# Patient Record
Sex: Female | Born: 1950
Health system: Southern US, Community
[De-identification: ages and names within clinical notes are randomized; demographics above are authoritative.]

## PROBLEM LIST (undated history)

## (undated) DIAGNOSIS — E7800 Pure hypercholesterolemia, unspecified: Secondary | ICD-10-CM

## (undated) DIAGNOSIS — H04129 Dry eye syndrome of unspecified lacrimal gland: Secondary | ICD-10-CM

## (undated) DIAGNOSIS — K449 Diaphragmatic hernia without obstruction or gangrene: Secondary | ICD-10-CM

## (undated) DIAGNOSIS — E079 Disorder of thyroid, unspecified: Secondary | ICD-10-CM

## (undated) DIAGNOSIS — M810 Age-related osteoporosis without current pathological fracture: Secondary | ICD-10-CM

## (undated) DIAGNOSIS — I10 Essential (primary) hypertension: Secondary | ICD-10-CM

## (undated) HISTORY — DX: Age-related osteoporosis without current pathological fracture: M81.0

## (undated) HISTORY — DX: Dry eye syndrome of unspecified lacrimal gland: H04.129

## (undated) HISTORY — DX: Essential (primary) hypertension: I10

## (undated) HISTORY — DX: Diaphragmatic hernia without obstruction or gangrene: K44.9

## (undated) HISTORY — DX: Pure hypercholesterolemia, unspecified: E78.00

## (undated) HISTORY — DX: Disorder of thyroid, unspecified: E07.9

---

## 1998-07-15 ENCOUNTER — Ambulatory Visit (HOSPITAL_COMMUNITY): Admission: RE | Admit: 1998-07-15 | Discharge: 1998-07-16 | Payer: Self-pay | Admitting: General Surgery

## 1998-11-04 ENCOUNTER — Other Ambulatory Visit: Admission: RE | Admit: 1998-11-04 | Discharge: 1998-11-04 | Payer: Self-pay | Admitting: Obstetrics and Gynecology

## 1999-11-18 ENCOUNTER — Other Ambulatory Visit: Admission: RE | Admit: 1999-11-18 | Discharge: 1999-11-18 | Payer: Self-pay | Admitting: Obstetrics and Gynecology

## 2002-01-03 ENCOUNTER — Other Ambulatory Visit: Admission: RE | Admit: 2002-01-03 | Discharge: 2002-01-03 | Payer: Self-pay | Admitting: Obstetrics and Gynecology

## 2003-01-10 ENCOUNTER — Other Ambulatory Visit: Admission: RE | Admit: 2003-01-10 | Discharge: 2003-01-10 | Payer: Self-pay | Admitting: Obstetrics and Gynecology

## 2013-07-04 ENCOUNTER — Other Ambulatory Visit: Payer: Self-pay | Admitting: Obstetrics and Gynecology

## 2013-07-04 DIAGNOSIS — R928 Other abnormal and inconclusive findings on diagnostic imaging of breast: Secondary | ICD-10-CM

## 2013-07-14 ENCOUNTER — Ambulatory Visit
Admission: RE | Admit: 2013-07-14 | Discharge: 2013-07-14 | Disposition: A | Discharge status: Home / Self Care | Payer: BC Managed Care – PPO | Source: Ambulatory Visit | Attending: Obstetrics and Gynecology | Admitting: Obstetrics and Gynecology

## 2013-07-14 ENCOUNTER — Other Ambulatory Visit: Payer: Self-pay | Admitting: Obstetrics and Gynecology

## 2013-07-14 DIAGNOSIS — R928 Other abnormal and inconclusive findings on diagnostic imaging of breast: Secondary | ICD-10-CM

## 2013-07-19 ENCOUNTER — Ambulatory Visit
Admission: RE | Admit: 2013-07-19 | Discharge: 2013-07-19 | Disposition: A | Discharge status: Home / Self Care | Payer: BC Managed Care – PPO | Source: Ambulatory Visit | Attending: Obstetrics and Gynecology | Admitting: Obstetrics and Gynecology

## 2013-07-19 ENCOUNTER — Other Ambulatory Visit: Payer: Self-pay | Admitting: Obstetrics and Gynecology

## 2013-07-19 DIAGNOSIS — R928 Other abnormal and inconclusive findings on diagnostic imaging of breast: Secondary | ICD-10-CM

## 2014-06-01 IMAGING — MG MM DIAGNOSTIC UNILATERAL R
3 series · 3 of 3 positions shown · non-contrast
Comparison: Previous exams.

CLINICAL DATA: Patient presents for additional views of the right
breast as followup to a recent screening exam to evaluate a possible
mass.

EXAM:
DIGITAL DIAGNOSTIC  right MAMMOGRAM
ULTRASOUND right BREAST

[R CC]
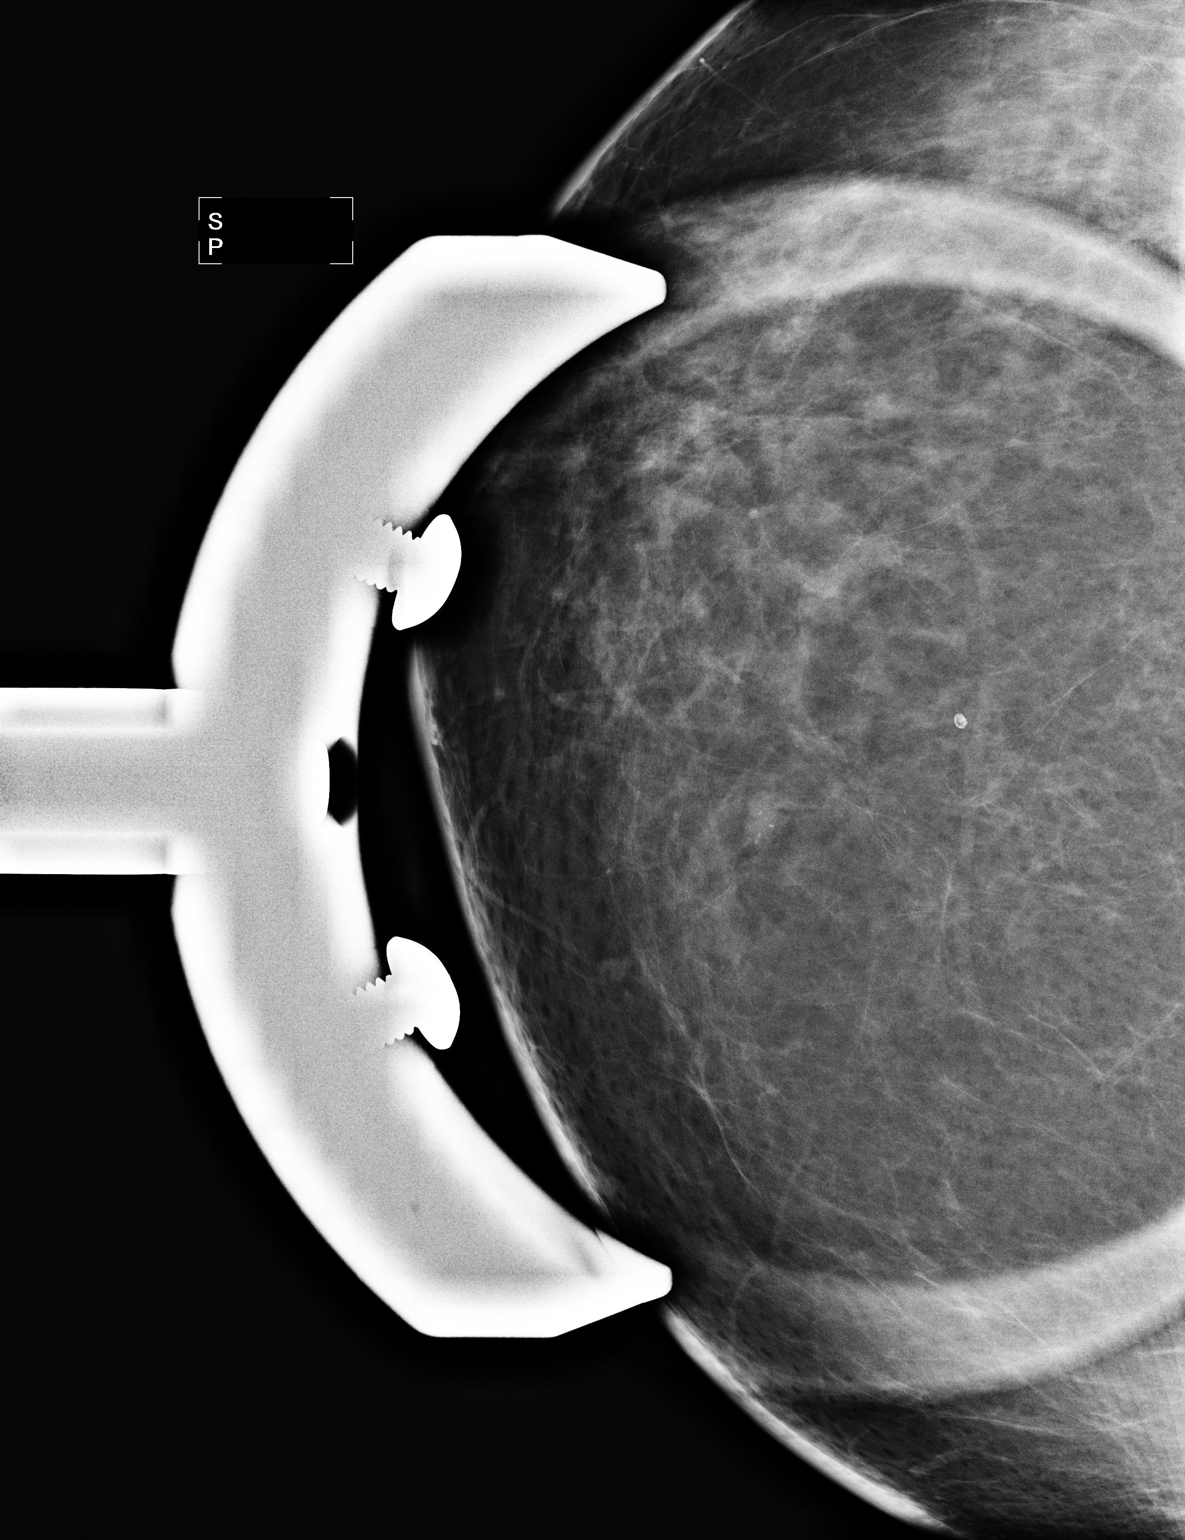

[R ML (1 of 2)]
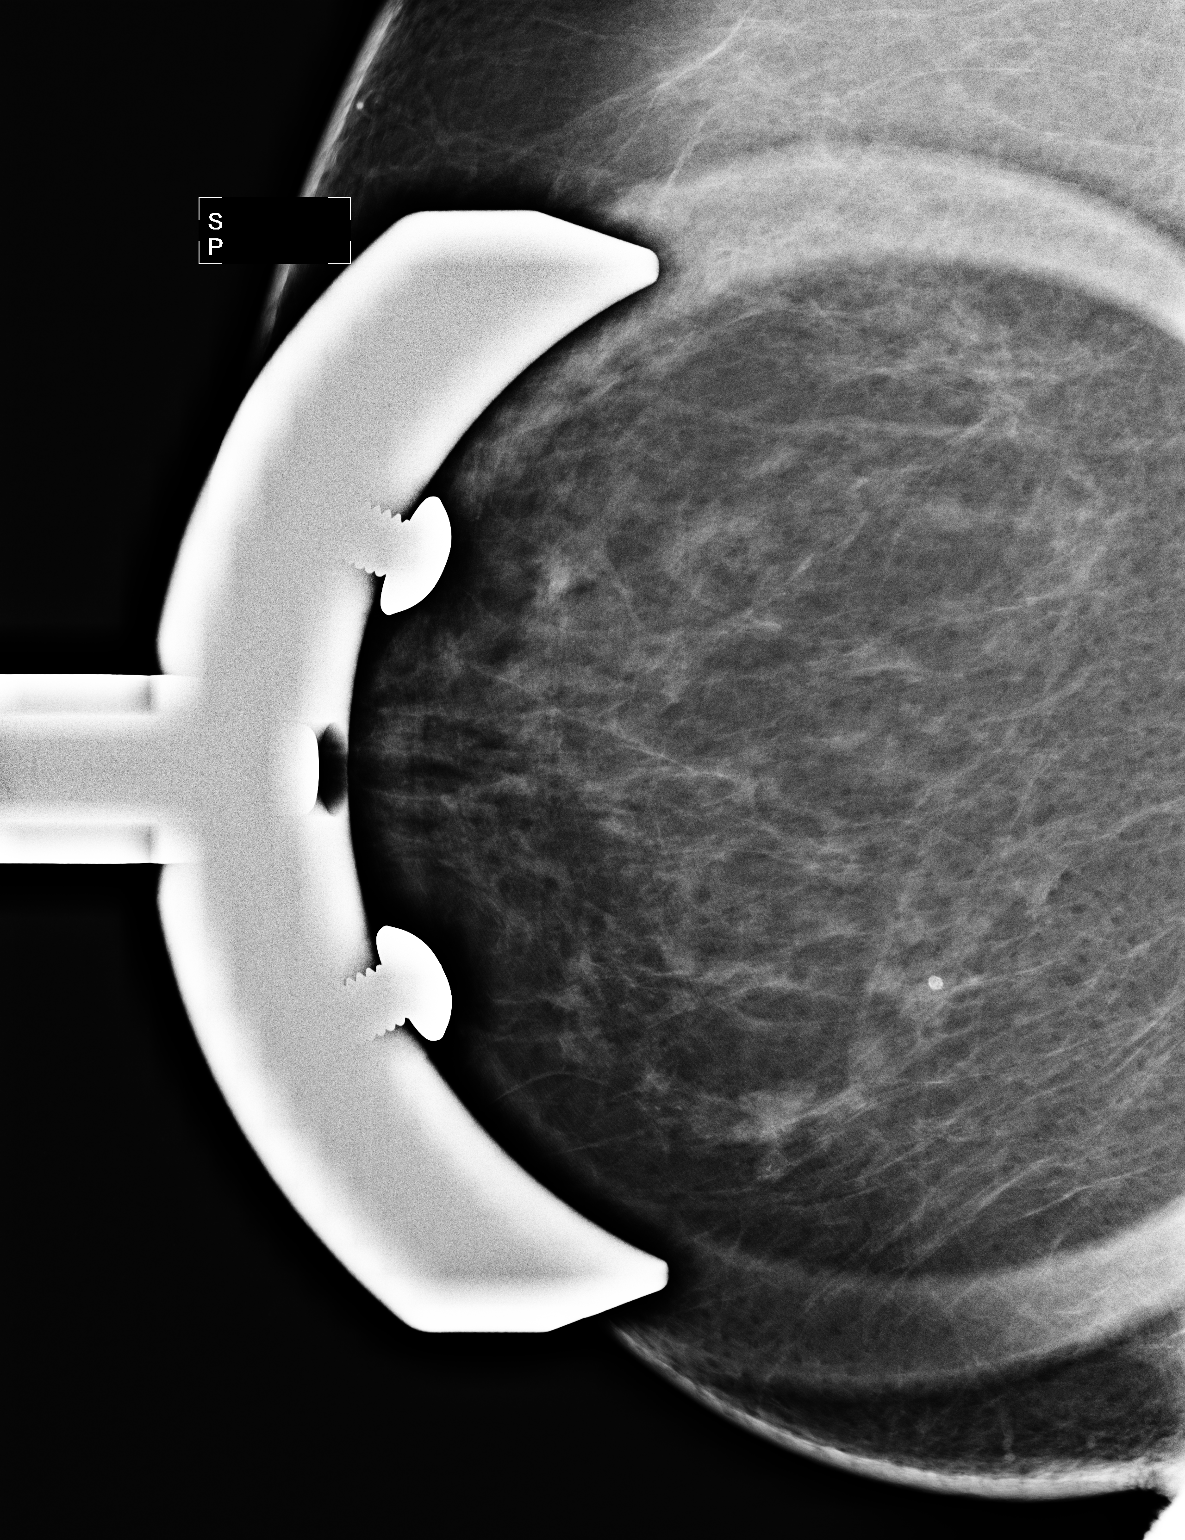

[R ML (2 of 2)]
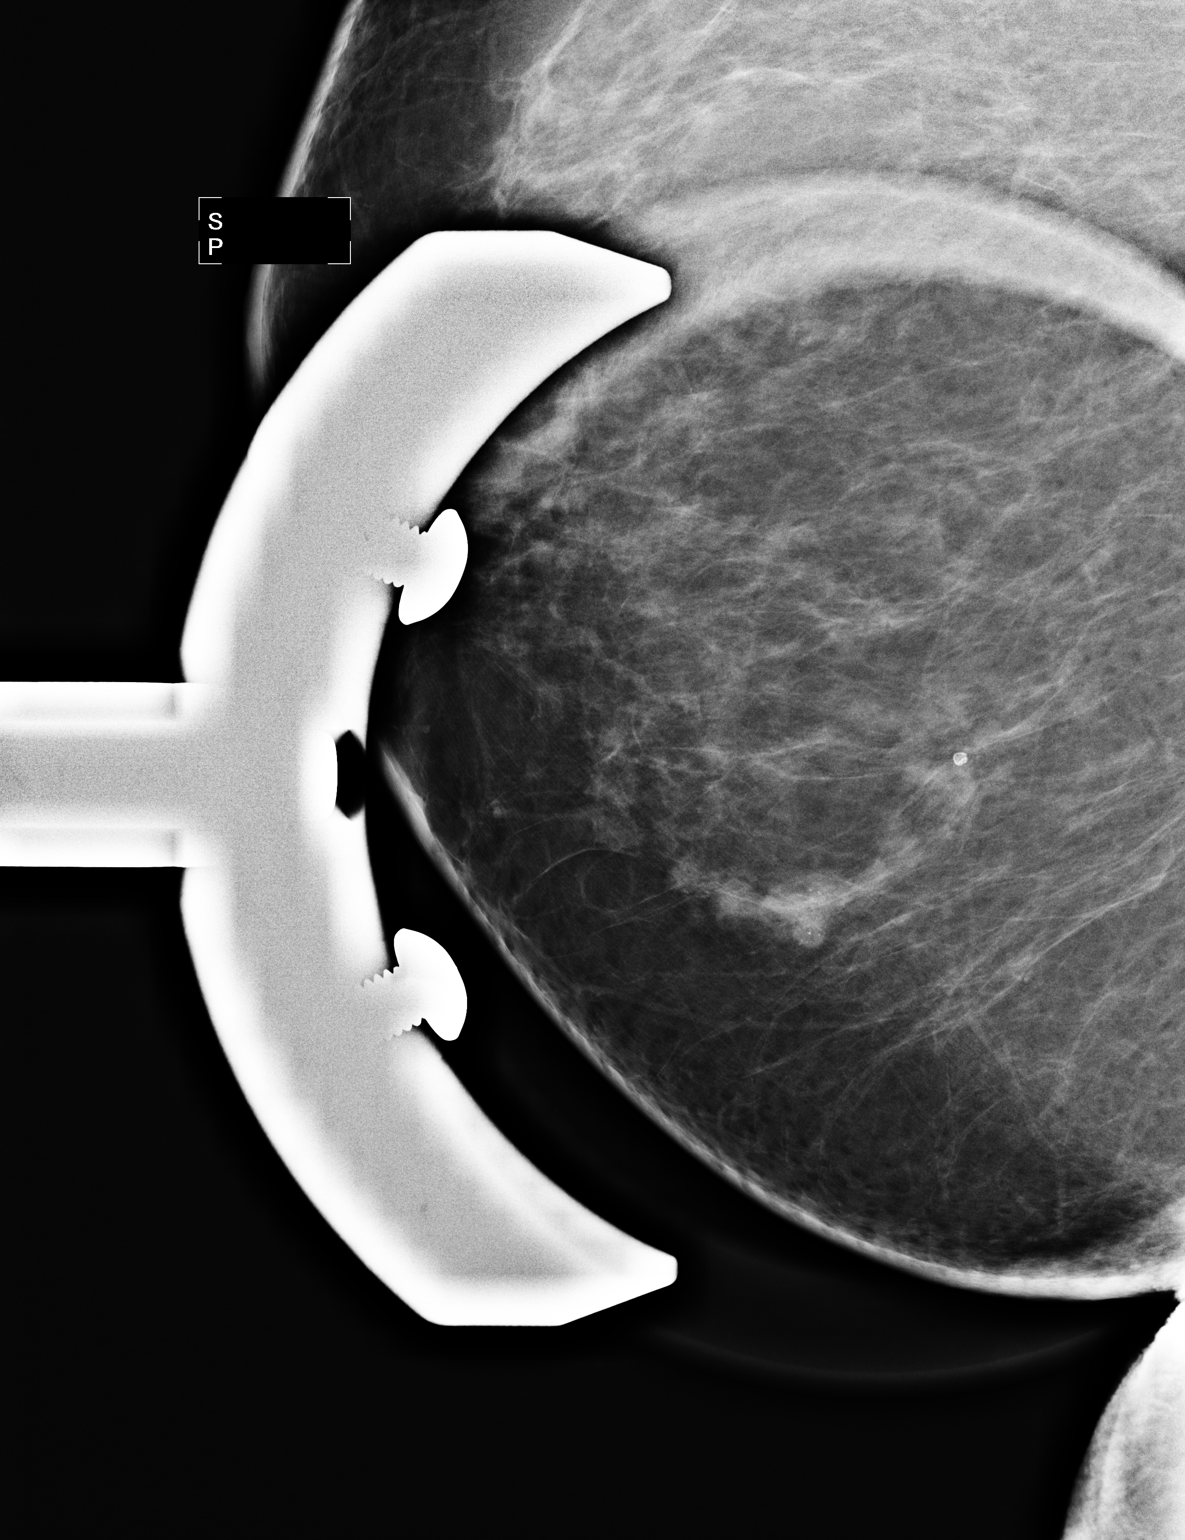

[3 of 3 positions shown; findings below may reference images not displayed]

ACR Breast Density Category b: There are scattered areas of
fibroglandular density.
FINDINGS: Spot magnification images demonstrate a 5 mm ovoid mass with
associated mildly heterogeneous microcalcifications over the inner
lower right breast.

Ultrasound is performed, showing an ovoid hypoechoic mass with
microcalcifications and somewhat indistinct borders over the 4 to 5
o'clock position of the right breast 3-4 cm from the nipple likely
corresponding to the mammographic abnormality. Ultrasound of the
right axilla is unremarkable.
IMPRESSION: Indeterminate 5 mm mass with associated microcalcifications over the
inner lower right breast.

RECOMMENDATION:
Recommend ultrasound core needle biopsy for further evaluation.

I have discussed the findings and recommendations with the patient.
Results were also provided in writing at the conclusion of the
visit. If applicable, a reminder letter will be sent to the patient
regarding the next appointment.

BI-RADS CATEGORY  4: Suspicious abnormality - biopsy should be
considered.

Biopsy scheduled for 07/19/2013 at 8 a.m..

## 2014-06-06 IMAGING — MG MM RT BREAST BX W LOC 1ST LESION STEREO
3 series · 3 of 3 positions shown · non-contrast
Comparison: Previous exams.

ADDENDUM:
Pathology revealed fibrocystic changes with calcifications in the
right breast. This was found to be concordant by Dr. Stev Borge.
Pathology was relayed to the patient by telephone. The patient
reported tenderness at the biopsy site. Post biopsy instructions
were reviewed and her questions were answered. She was encouraged to
call The [REDACTED] for any additional
concerns. She was asked to return to [HOSPITAL] for annual
mammography.

Pathology results reported by Kagiso Kg Paballo RN, BSN on July 20, 2013.
CLINICAL DATA: Right breast calcifications and associated mass.
EXAM:
RIGHT STEREOTACTIC CORE NEEDLE BIOPSY

[R CC (1 of 2)]
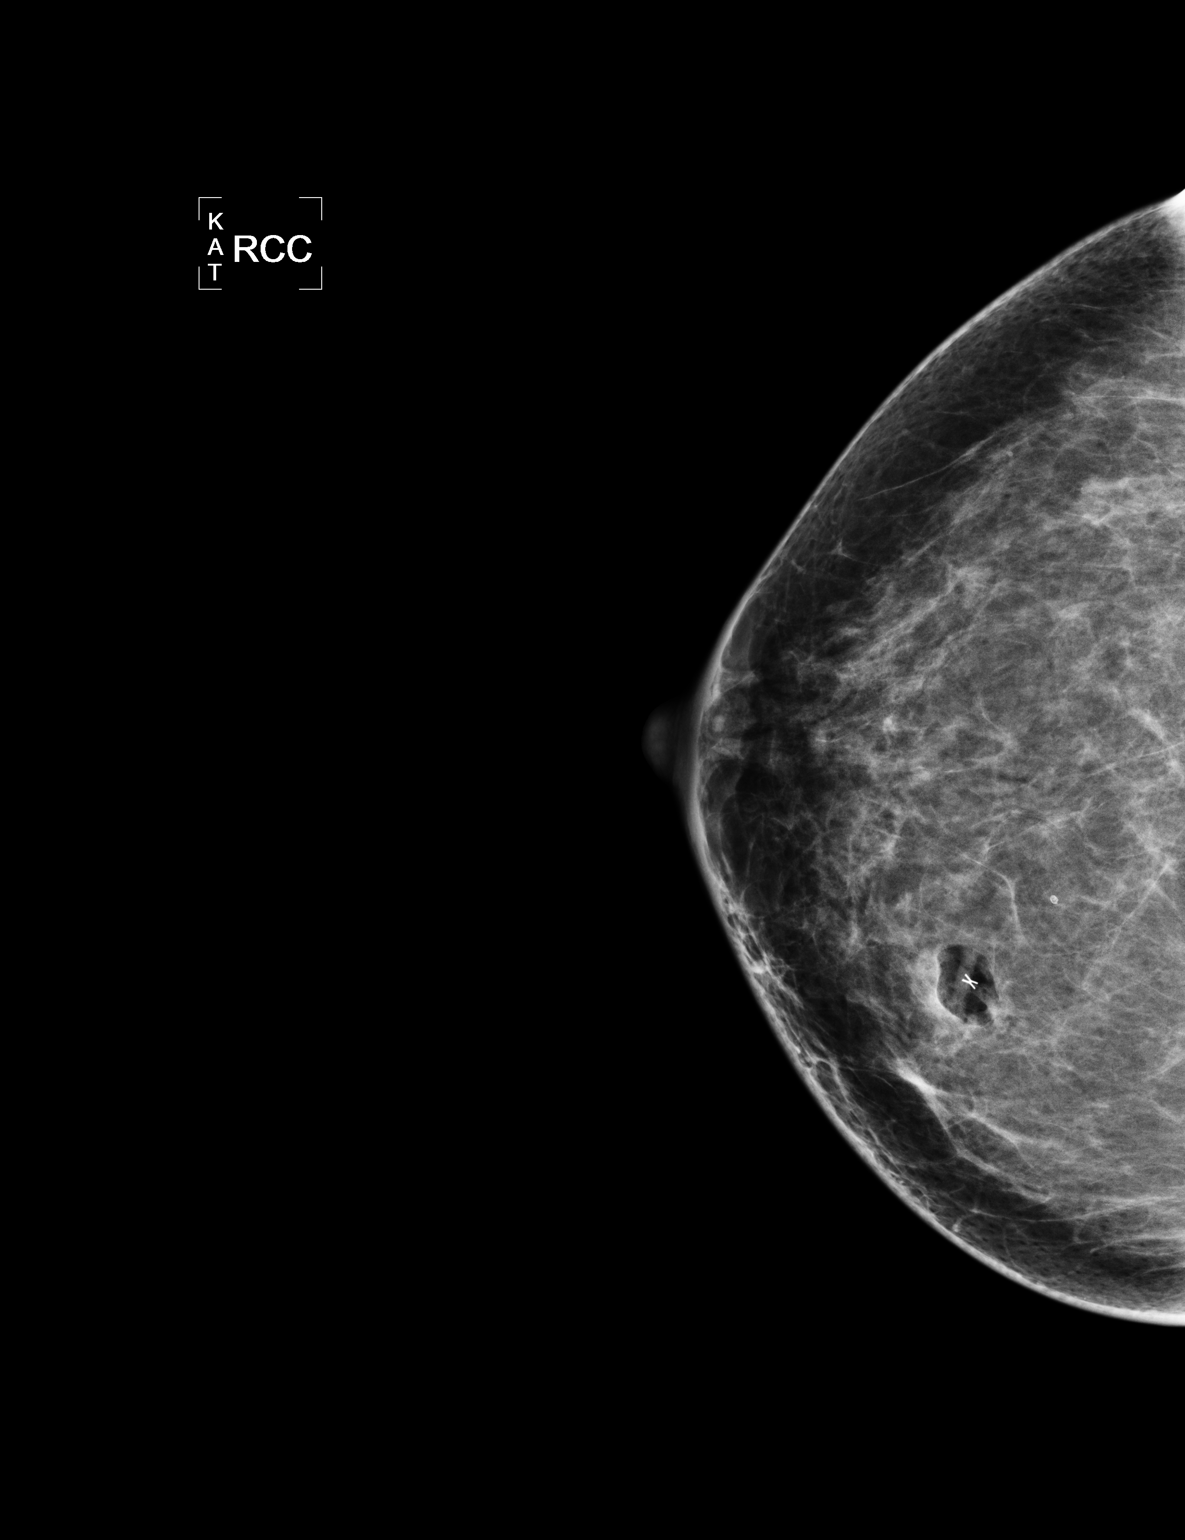

[R CC (2 of 2)]
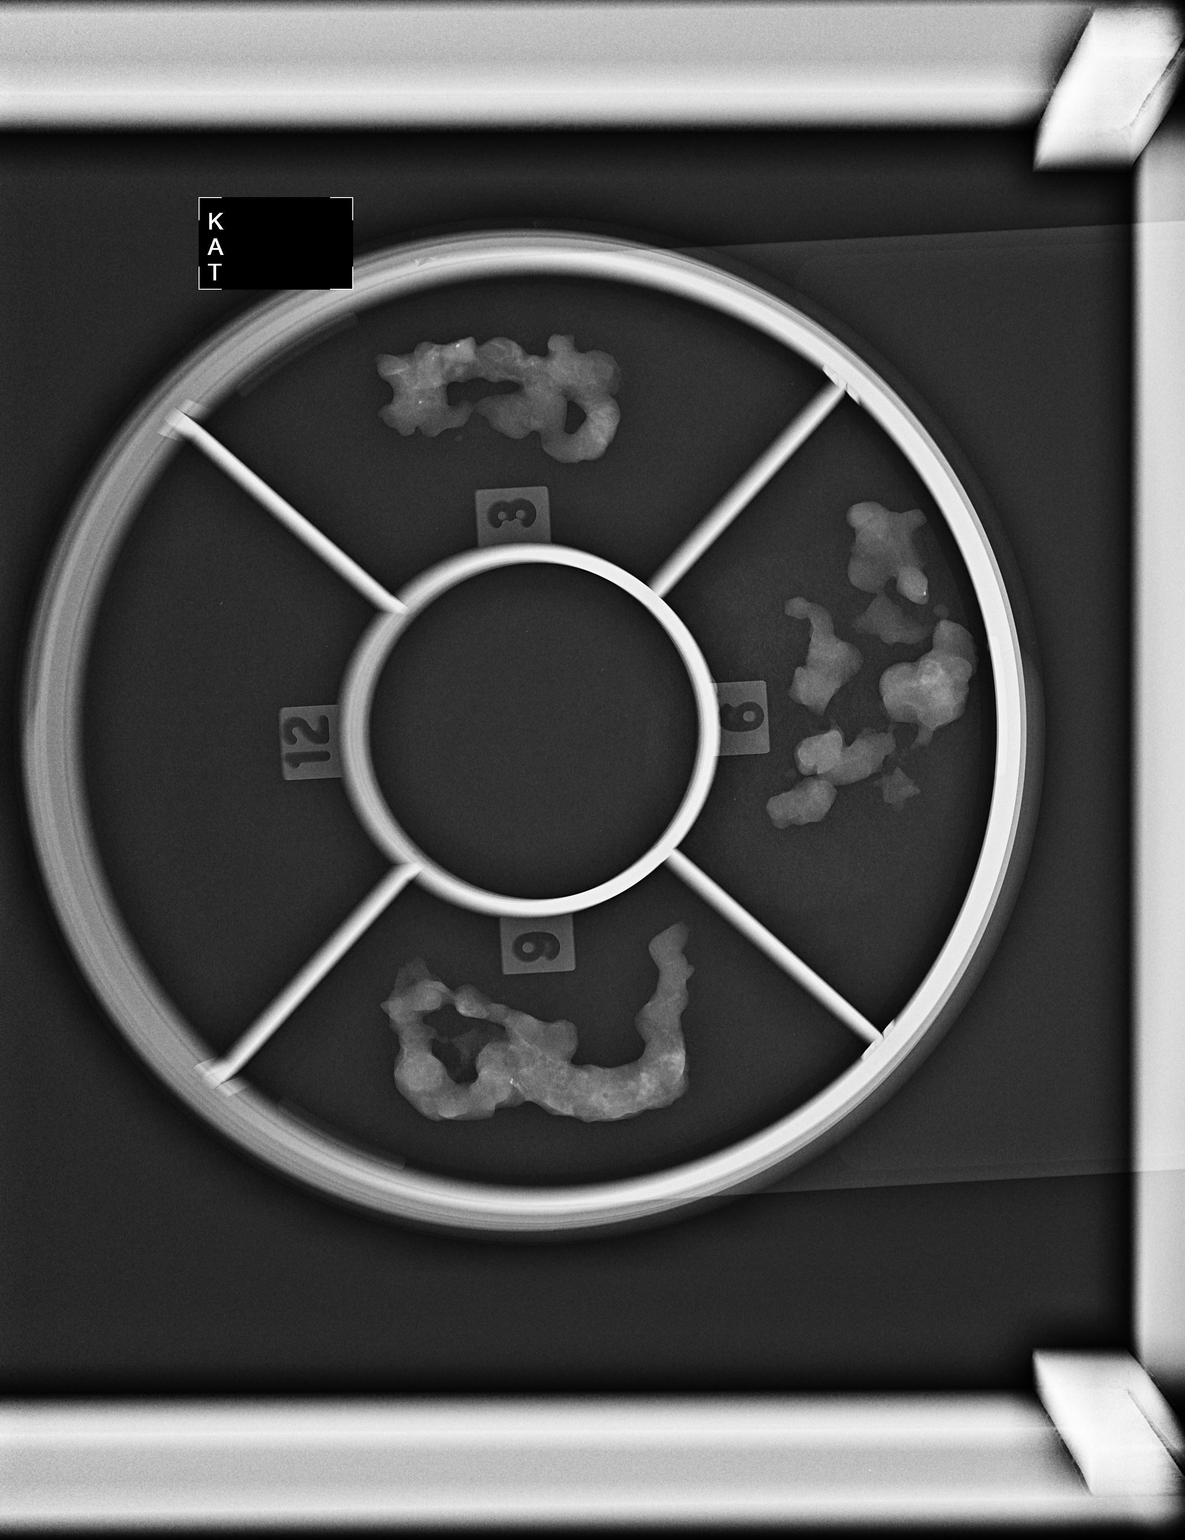

[R ML]
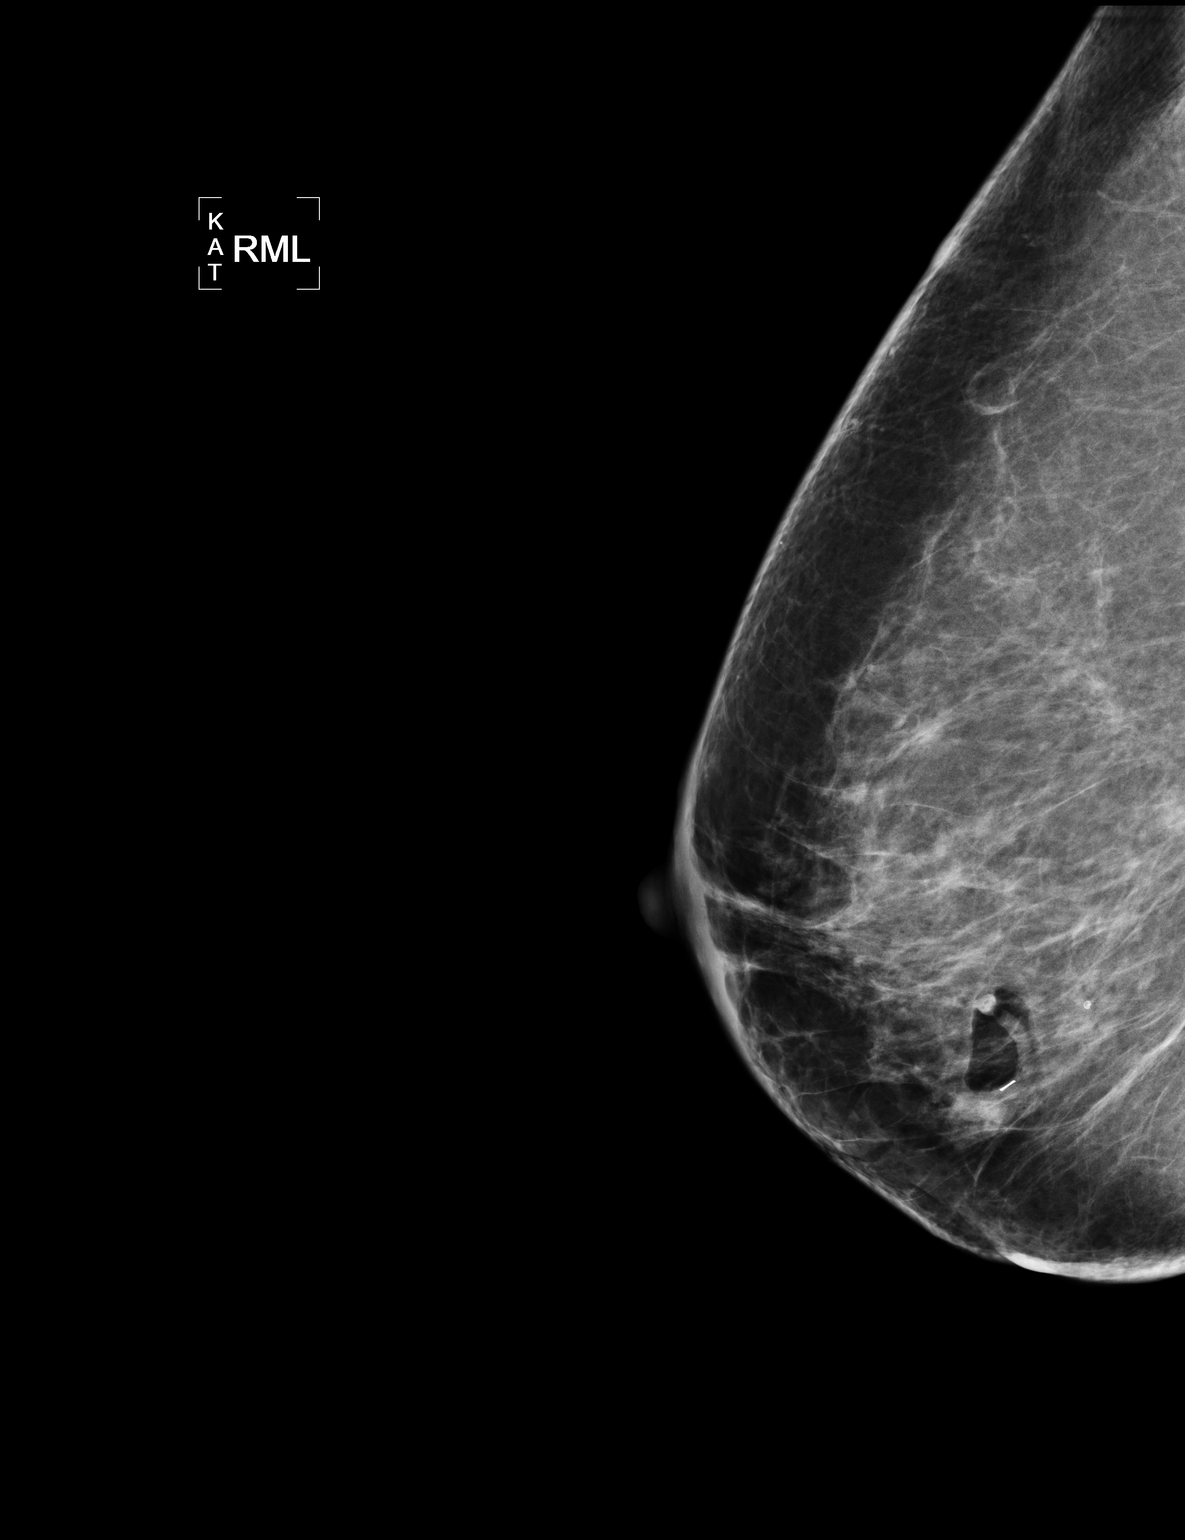

[3 of 3 positions shown; findings below may reference images not displayed]



Using sterile technique and 2% Lidocaine as local anesthetic, under
stereotactic guidance, a 9 gauge vacuum assisted core needle biopsy
device was used to perform core needle biopsy of calcifications and
mass within the lower inner right breast using a caudal approach.
Specimen radiograph was performed showing calcifications. Specimens
with calcifications are identified for pathology.

At the conclusion of the procedure, a H shaped tissue marker clip
was deployed into the biopsy cavity. Follow-up 2-view mammogram
confirmed clip in appropriate position.
IMPRESSION: Stereotactic-guided biopsy of right breast mass and calcifications.
No apparent complications.

## 2015-07-22 DIAGNOSIS — J069 Acute upper respiratory infection, unspecified: Secondary | ICD-10-CM | POA: Diagnosis not present

## 2015-09-10 DIAGNOSIS — R51 Headache: Secondary | ICD-10-CM | POA: Diagnosis not present

## 2015-09-10 DIAGNOSIS — G43019 Migraine without aura, intractable, without status migrainosus: Secondary | ICD-10-CM | POA: Diagnosis not present

## 2015-09-10 DIAGNOSIS — Z79899 Other long term (current) drug therapy: Secondary | ICD-10-CM | POA: Diagnosis not present

## 2015-09-10 DIAGNOSIS — G43719 Chronic migraine without aura, intractable, without status migrainosus: Secondary | ICD-10-CM | POA: Diagnosis not present

## 2015-09-12 DIAGNOSIS — Z01419 Encounter for gynecological examination (general) (routine) without abnormal findings: Secondary | ICD-10-CM | POA: Diagnosis not present

## 2015-09-12 DIAGNOSIS — Z1231 Encounter for screening mammogram for malignant neoplasm of breast: Secondary | ICD-10-CM | POA: Diagnosis not present

## 2016-03-05 DIAGNOSIS — G43719 Chronic migraine without aura, intractable, without status migrainosus: Secondary | ICD-10-CM | POA: Diagnosis not present

## 2016-03-05 DIAGNOSIS — G43019 Migraine without aura, intractable, without status migrainosus: Secondary | ICD-10-CM | POA: Diagnosis not present

## 2016-03-05 DIAGNOSIS — Z23 Encounter for immunization: Secondary | ICD-10-CM | POA: Diagnosis not present

## 2016-03-05 DIAGNOSIS — Z79899 Other long term (current) drug therapy: Secondary | ICD-10-CM | POA: Diagnosis not present

## 2016-04-17 DIAGNOSIS — L255 Unspecified contact dermatitis due to plants, except food: Secondary | ICD-10-CM | POA: Diagnosis not present

## 2016-05-05 DIAGNOSIS — Z23 Encounter for immunization: Secondary | ICD-10-CM | POA: Diagnosis not present

## 2016-05-05 DIAGNOSIS — I10 Essential (primary) hypertension: Secondary | ICD-10-CM | POA: Diagnosis not present

## 2016-05-05 DIAGNOSIS — Z6824 Body mass index (BMI) 24.0-24.9, adult: Secondary | ICD-10-CM | POA: Diagnosis not present

## 2016-05-05 DIAGNOSIS — Z9181 History of falling: Secondary | ICD-10-CM | POA: Diagnosis not present

## 2016-05-05 DIAGNOSIS — Z1389 Encounter for screening for other disorder: Secondary | ICD-10-CM | POA: Diagnosis not present

## 2016-05-05 DIAGNOSIS — E039 Hypothyroidism, unspecified: Secondary | ICD-10-CM | POA: Diagnosis not present

## 2016-05-05 DIAGNOSIS — E785 Hyperlipidemia, unspecified: Secondary | ICD-10-CM | POA: Diagnosis not present

## 2016-05-05 DIAGNOSIS — Z79899 Other long term (current) drug therapy: Secondary | ICD-10-CM | POA: Diagnosis not present

## 2016-08-27 DIAGNOSIS — G43019 Migraine without aura, intractable, without status migrainosus: Secondary | ICD-10-CM | POA: Diagnosis not present

## 2016-08-27 DIAGNOSIS — G43719 Chronic migraine without aura, intractable, without status migrainosus: Secondary | ICD-10-CM | POA: Diagnosis not present

## 2016-09-14 DIAGNOSIS — Z01419 Encounter for gynecological examination (general) (routine) without abnormal findings: Secondary | ICD-10-CM | POA: Diagnosis not present

## 2016-09-14 DIAGNOSIS — Z1231 Encounter for screening mammogram for malignant neoplasm of breast: Secondary | ICD-10-CM | POA: Diagnosis not present

## 2017-02-23 DIAGNOSIS — Z23 Encounter for immunization: Secondary | ICD-10-CM | POA: Diagnosis not present

## 2017-03-10 DIAGNOSIS — Z800 Family history of malignant neoplasm of digestive organs: Secondary | ICD-10-CM | POA: Diagnosis not present

## 2017-03-10 DIAGNOSIS — Z6823 Body mass index (BMI) 23.0-23.9, adult: Secondary | ICD-10-CM | POA: Diagnosis not present

## 2017-03-10 DIAGNOSIS — Z1211 Encounter for screening for malignant neoplasm of colon: Secondary | ICD-10-CM | POA: Diagnosis not present

## 2017-03-10 DIAGNOSIS — K625 Hemorrhage of anus and rectum: Secondary | ICD-10-CM | POA: Diagnosis not present

## 2017-03-30 DIAGNOSIS — Z800 Family history of malignant neoplasm of digestive organs: Secondary | ICD-10-CM | POA: Diagnosis not present

## 2017-03-30 DIAGNOSIS — K921 Melena: Secondary | ICD-10-CM | POA: Diagnosis not present

## 2017-04-14 DIAGNOSIS — Z1211 Encounter for screening for malignant neoplasm of colon: Secondary | ICD-10-CM | POA: Diagnosis not present

## 2017-04-14 DIAGNOSIS — Z800 Family history of malignant neoplasm of digestive organs: Secondary | ICD-10-CM | POA: Diagnosis not present

## 2017-07-06 DIAGNOSIS — Z0000 Encounter for general adult medical examination without abnormal findings: Secondary | ICD-10-CM | POA: Diagnosis not present

## 2017-07-06 DIAGNOSIS — E039 Hypothyroidism, unspecified: Secondary | ICD-10-CM | POA: Diagnosis not present

## 2017-07-06 DIAGNOSIS — E785 Hyperlipidemia, unspecified: Secondary | ICD-10-CM | POA: Diagnosis not present

## 2017-07-06 DIAGNOSIS — M8580 Other specified disorders of bone density and structure, unspecified site: Secondary | ICD-10-CM | POA: Diagnosis not present

## 2017-07-06 DIAGNOSIS — I10 Essential (primary) hypertension: Secondary | ICD-10-CM | POA: Diagnosis not present

## 2017-07-06 DIAGNOSIS — Z6823 Body mass index (BMI) 23.0-23.9, adult: Secondary | ICD-10-CM | POA: Diagnosis not present

## 2017-07-06 DIAGNOSIS — Z79899 Other long term (current) drug therapy: Secondary | ICD-10-CM | POA: Diagnosis not present

## 2017-08-16 DIAGNOSIS — G43019 Migraine without aura, intractable, without status migrainosus: Secondary | ICD-10-CM | POA: Diagnosis not present

## 2017-08-16 DIAGNOSIS — G43719 Chronic migraine without aura, intractable, without status migrainosus: Secondary | ICD-10-CM | POA: Diagnosis not present

## 2017-09-29 DIAGNOSIS — Z6822 Body mass index (BMI) 22.0-22.9, adult: Secondary | ICD-10-CM | POA: Diagnosis not present

## 2017-09-29 DIAGNOSIS — Z1231 Encounter for screening mammogram for malignant neoplasm of breast: Secondary | ICD-10-CM | POA: Diagnosis not present

## 2017-09-29 DIAGNOSIS — Z01419 Encounter for gynecological examination (general) (routine) without abnormal findings: Secondary | ICD-10-CM | POA: Diagnosis not present

## 2018-02-28 DIAGNOSIS — Z23 Encounter for immunization: Secondary | ICD-10-CM | POA: Diagnosis not present

## 2018-04-12 DIAGNOSIS — G43719 Chronic migraine without aura, intractable, without status migrainosus: Secondary | ICD-10-CM | POA: Diagnosis not present

## 2018-04-12 DIAGNOSIS — G43019 Migraine without aura, intractable, without status migrainosus: Secondary | ICD-10-CM | POA: Diagnosis not present

## 2018-05-17 DIAGNOSIS — E039 Hypothyroidism, unspecified: Secondary | ICD-10-CM | POA: Diagnosis not present

## 2018-05-17 DIAGNOSIS — E785 Hyperlipidemia, unspecified: Secondary | ICD-10-CM | POA: Diagnosis not present

## 2018-05-17 DIAGNOSIS — I10 Essential (primary) hypertension: Secondary | ICD-10-CM | POA: Diagnosis not present

## 2018-08-17 DIAGNOSIS — E039 Hypothyroidism, unspecified: Secondary | ICD-10-CM | POA: Diagnosis not present

## 2018-08-17 DIAGNOSIS — C7972 Secondary malignant neoplasm of left adrenal gland: Secondary | ICD-10-CM | POA: Diagnosis not present

## 2018-08-17 DIAGNOSIS — E871 Hypo-osmolality and hyponatremia: Secondary | ICD-10-CM | POA: Diagnosis not present

## 2018-08-17 DIAGNOSIS — Z23 Encounter for immunization: Secondary | ICD-10-CM | POA: Diagnosis not present

## 2018-08-17 DIAGNOSIS — K921 Melena: Secondary | ICD-10-CM | POA: Diagnosis not present

## 2018-08-17 DIAGNOSIS — Z1159 Encounter for screening for other viral diseases: Secondary | ICD-10-CM | POA: Diagnosis not present

## 2018-08-17 DIAGNOSIS — D692 Other nonthrombocytopenic purpura: Secondary | ICD-10-CM | POA: Diagnosis not present

## 2018-08-17 DIAGNOSIS — Z6824 Body mass index (BMI) 24.0-24.9, adult: Secondary | ICD-10-CM | POA: Diagnosis not present

## 2018-08-17 DIAGNOSIS — E7800 Pure hypercholesterolemia, unspecified: Secondary | ICD-10-CM | POA: Diagnosis not present

## 2018-08-17 DIAGNOSIS — Z79899 Other long term (current) drug therapy: Secondary | ICD-10-CM | POA: Diagnosis not present

## 2018-08-17 DIAGNOSIS — Z0000 Encounter for general adult medical examination without abnormal findings: Secondary | ICD-10-CM | POA: Diagnosis not present

## 2018-08-17 DIAGNOSIS — I2510 Atherosclerotic heart disease of native coronary artery without angina pectoris: Secondary | ICD-10-CM | POA: Diagnosis not present

## 2018-08-17 DIAGNOSIS — N6092 Unspecified benign mammary dysplasia of left breast: Secondary | ICD-10-CM | POA: Diagnosis not present

## 2018-08-17 DIAGNOSIS — C3490 Malignant neoplasm of unspecified part of unspecified bronchus or lung: Secondary | ICD-10-CM | POA: Diagnosis not present

## 2018-08-17 DIAGNOSIS — I10 Essential (primary) hypertension: Secondary | ICD-10-CM | POA: Diagnosis not present

## 2018-08-17 DIAGNOSIS — E785 Hyperlipidemia, unspecified: Secondary | ICD-10-CM | POA: Diagnosis not present

## 2018-08-17 DIAGNOSIS — M810 Age-related osteoporosis without current pathological fracture: Secondary | ICD-10-CM | POA: Diagnosis not present

## 2018-08-17 DIAGNOSIS — R3129 Other microscopic hematuria: Secondary | ICD-10-CM | POA: Diagnosis not present

## 2018-08-17 DIAGNOSIS — J189 Pneumonia, unspecified organism: Secondary | ICD-10-CM | POA: Diagnosis not present

## 2018-08-17 DIAGNOSIS — K920 Hematemesis: Secondary | ICD-10-CM | POA: Diagnosis not present

## 2018-10-04 DIAGNOSIS — G43719 Chronic migraine without aura, intractable, without status migrainosus: Secondary | ICD-10-CM | POA: Diagnosis not present

## 2018-10-04 DIAGNOSIS — G43019 Migraine without aura, intractable, without status migrainosus: Secondary | ICD-10-CM | POA: Diagnosis not present

## 2018-10-12 DIAGNOSIS — Z1231 Encounter for screening mammogram for malignant neoplasm of breast: Secondary | ICD-10-CM | POA: Diagnosis not present

## 2018-10-12 DIAGNOSIS — Z01419 Encounter for gynecological examination (general) (routine) without abnormal findings: Secondary | ICD-10-CM | POA: Diagnosis not present

## 2018-12-19 ENCOUNTER — Other Ambulatory Visit: Payer: Self-pay

## 2019-01-11 DIAGNOSIS — M25572 Pain in left ankle and joints of left foot: Secondary | ICD-10-CM | POA: Diagnosis not present

## 2019-01-11 DIAGNOSIS — M25571 Pain in right ankle and joints of right foot: Secondary | ICD-10-CM | POA: Diagnosis not present

## 2019-01-11 DIAGNOSIS — Z6824 Body mass index (BMI) 24.0-24.9, adult: Secondary | ICD-10-CM | POA: Diagnosis not present

## 2019-01-13 DIAGNOSIS — S93491A Sprain of other ligament of right ankle, initial encounter: Secondary | ICD-10-CM | POA: Diagnosis not present

## 2019-01-13 DIAGNOSIS — S82842A Displaced bimalleolar fracture of left lower leg, initial encounter for closed fracture: Secondary | ICD-10-CM | POA: Diagnosis not present

## 2019-01-16 DIAGNOSIS — R300 Dysuria: Secondary | ICD-10-CM | POA: Diagnosis not present

## 2019-01-18 DIAGNOSIS — Z7982 Long term (current) use of aspirin: Secondary | ICD-10-CM | POA: Diagnosis not present

## 2019-01-18 DIAGNOSIS — M810 Age-related osteoporosis without current pathological fracture: Secondary | ICD-10-CM | POA: Diagnosis not present

## 2019-01-18 DIAGNOSIS — I10 Essential (primary) hypertension: Secondary | ICD-10-CM | POA: Diagnosis not present

## 2019-01-18 DIAGNOSIS — Z79899 Other long term (current) drug therapy: Secondary | ICD-10-CM | POA: Diagnosis not present

## 2019-01-18 DIAGNOSIS — E039 Hypothyroidism, unspecified: Secondary | ICD-10-CM | POA: Diagnosis not present

## 2019-01-18 DIAGNOSIS — F419 Anxiety disorder, unspecified: Secondary | ICD-10-CM | POA: Diagnosis not present

## 2019-01-18 DIAGNOSIS — K219 Gastro-esophageal reflux disease without esophagitis: Secondary | ICD-10-CM | POA: Diagnosis not present

## 2019-01-18 DIAGNOSIS — S82842A Displaced bimalleolar fracture of left lower leg, initial encounter for closed fracture: Secondary | ICD-10-CM | POA: Diagnosis not present

## 2019-01-18 DIAGNOSIS — G8918 Other acute postprocedural pain: Secondary | ICD-10-CM | POA: Diagnosis not present

## 2019-02-02 DIAGNOSIS — S82842A Displaced bimalleolar fracture of left lower leg, initial encounter for closed fracture: Secondary | ICD-10-CM | POA: Diagnosis not present

## 2019-03-09 DIAGNOSIS — S82842A Displaced bimalleolar fracture of left lower leg, initial encounter for closed fracture: Secondary | ICD-10-CM | POA: Diagnosis not present

## 2019-03-22 DIAGNOSIS — Z23 Encounter for immunization: Secondary | ICD-10-CM | POA: Diagnosis not present

## 2019-04-04 DIAGNOSIS — G43019 Migraine without aura, intractable, without status migrainosus: Secondary | ICD-10-CM | POA: Diagnosis not present

## 2019-04-04 DIAGNOSIS — G43719 Chronic migraine without aura, intractable, without status migrainosus: Secondary | ICD-10-CM | POA: Diagnosis not present

## 2019-04-21 DIAGNOSIS — S82842A Displaced bimalleolar fracture of left lower leg, initial encounter for closed fracture: Secondary | ICD-10-CM | POA: Diagnosis not present

## 2019-04-21 DIAGNOSIS — T148XXD Other injury of unspecified body region, subsequent encounter: Secondary | ICD-10-CM | POA: Diagnosis not present

## 2019-05-05 DIAGNOSIS — S82842A Displaced bimalleolar fracture of left lower leg, initial encounter for closed fracture: Secondary | ICD-10-CM | POA: Diagnosis not present

## 2019-06-06 DIAGNOSIS — S82842A Displaced bimalleolar fracture of left lower leg, initial encounter for closed fracture: Secondary | ICD-10-CM | POA: Diagnosis not present

## 2019-06-22 DIAGNOSIS — L97322 Non-pressure chronic ulcer of left ankle with fat layer exposed: Secondary | ICD-10-CM | POA: Diagnosis not present

## 2019-06-22 DIAGNOSIS — Z4789 Encounter for other orthopedic aftercare: Secondary | ICD-10-CM | POA: Diagnosis not present

## 2019-06-22 DIAGNOSIS — T8189XA Other complications of procedures, not elsewhere classified, initial encounter: Secondary | ICD-10-CM | POA: Diagnosis not present

## 2019-06-22 DIAGNOSIS — I10 Essential (primary) hypertension: Secondary | ICD-10-CM | POA: Diagnosis not present

## 2019-06-27 DIAGNOSIS — S91302A Unspecified open wound, left foot, initial encounter: Secondary | ICD-10-CM | POA: Diagnosis not present

## 2019-06-29 DIAGNOSIS — T8189XA Other complications of procedures, not elsewhere classified, initial encounter: Secondary | ICD-10-CM | POA: Diagnosis not present

## 2019-06-29 DIAGNOSIS — I10 Essential (primary) hypertension: Secondary | ICD-10-CM | POA: Diagnosis not present

## 2019-06-29 DIAGNOSIS — Z4889 Encounter for other specified surgical aftercare: Secondary | ICD-10-CM | POA: Diagnosis not present

## 2019-07-05 DIAGNOSIS — I10 Essential (primary) hypertension: Secondary | ICD-10-CM | POA: Diagnosis not present

## 2019-07-05 DIAGNOSIS — Z4889 Encounter for other specified surgical aftercare: Secondary | ICD-10-CM | POA: Diagnosis not present

## 2019-07-05 DIAGNOSIS — T8189XA Other complications of procedures, not elsewhere classified, initial encounter: Secondary | ICD-10-CM | POA: Diagnosis not present

## 2019-07-13 DIAGNOSIS — Z4889 Encounter for other specified surgical aftercare: Secondary | ICD-10-CM | POA: Diagnosis not present

## 2019-07-13 DIAGNOSIS — T8189XA Other complications of procedures, not elsewhere classified, initial encounter: Secondary | ICD-10-CM | POA: Diagnosis not present

## 2019-07-13 DIAGNOSIS — I10 Essential (primary) hypertension: Secondary | ICD-10-CM | POA: Diagnosis not present

## 2019-07-26 DIAGNOSIS — T8189XA Other complications of procedures, not elsewhere classified, initial encounter: Secondary | ICD-10-CM | POA: Diagnosis not present

## 2019-07-26 DIAGNOSIS — L89329 Pressure ulcer of left buttock, unspecified stage: Secondary | ICD-10-CM | POA: Diagnosis not present

## 2019-08-15 DIAGNOSIS — Z0000 Encounter for general adult medical examination without abnormal findings: Secondary | ICD-10-CM | POA: Diagnosis not present

## 2019-08-15 DIAGNOSIS — E039 Hypothyroidism, unspecified: Secondary | ICD-10-CM | POA: Diagnosis not present

## 2019-08-15 DIAGNOSIS — M810 Age-related osteoporosis without current pathological fracture: Secondary | ICD-10-CM | POA: Diagnosis not present

## 2019-08-15 DIAGNOSIS — I10 Essential (primary) hypertension: Secondary | ICD-10-CM | POA: Diagnosis not present

## 2019-08-15 DIAGNOSIS — E785 Hyperlipidemia, unspecified: Secondary | ICD-10-CM | POA: Diagnosis not present

## 2019-08-15 DIAGNOSIS — Z6824 Body mass index (BMI) 24.0-24.9, adult: Secondary | ICD-10-CM | POA: Diagnosis not present

## 2019-08-15 DIAGNOSIS — Z1331 Encounter for screening for depression: Secondary | ICD-10-CM | POA: Diagnosis not present

## 2019-08-15 DIAGNOSIS — Z79899 Other long term (current) drug therapy: Secondary | ICD-10-CM | POA: Diagnosis not present

## 2019-10-02 ENCOUNTER — Encounter (HOSPITAL_BASED_OUTPATIENT_CLINIC_OR_DEPARTMENT_OTHER): Payer: PPO | Admitting: Internal Medicine

## 2019-10-17 DIAGNOSIS — Z1231 Encounter for screening mammogram for malignant neoplasm of breast: Secondary | ICD-10-CM | POA: Diagnosis not present

## 2019-10-30 DIAGNOSIS — T8189XA Other complications of procedures, not elsewhere classified, initial encounter: Secondary | ICD-10-CM | POA: Diagnosis not present

## 2019-10-30 DIAGNOSIS — S91002A Unspecified open wound, left ankle, initial encounter: Secondary | ICD-10-CM | POA: Diagnosis not present

## 2019-11-09 DIAGNOSIS — Z1159 Encounter for screening for other viral diseases: Secondary | ICD-10-CM | POA: Diagnosis not present

## 2019-11-09 DIAGNOSIS — S82842A Displaced bimalleolar fracture of left lower leg, initial encounter for closed fracture: Secondary | ICD-10-CM | POA: Diagnosis not present

## 2019-11-09 DIAGNOSIS — Z1152 Encounter for screening for COVID-19: Secondary | ICD-10-CM | POA: Diagnosis not present

## 2019-11-17 DIAGNOSIS — Z1152 Encounter for screening for COVID-19: Secondary | ICD-10-CM | POA: Diagnosis not present

## 2019-11-17 DIAGNOSIS — Z1159 Encounter for screening for other viral diseases: Secondary | ICD-10-CM | POA: Diagnosis not present

## 2019-11-22 DIAGNOSIS — E039 Hypothyroidism, unspecified: Secondary | ICD-10-CM | POA: Diagnosis not present

## 2019-11-22 DIAGNOSIS — E785 Hyperlipidemia, unspecified: Secondary | ICD-10-CM | POA: Diagnosis not present

## 2019-11-22 DIAGNOSIS — G479 Sleep disorder, unspecified: Secondary | ICD-10-CM | POA: Diagnosis not present

## 2019-11-22 DIAGNOSIS — K219 Gastro-esophageal reflux disease without esophagitis: Secondary | ICD-10-CM | POA: Diagnosis not present

## 2019-11-22 DIAGNOSIS — Z79899 Other long term (current) drug therapy: Secondary | ICD-10-CM | POA: Diagnosis not present

## 2019-11-22 DIAGNOSIS — S82842A Displaced bimalleolar fracture of left lower leg, initial encounter for closed fracture: Secondary | ICD-10-CM | POA: Diagnosis not present

## 2019-11-22 DIAGNOSIS — T849XXA Unspecified complication of internal orthopedic prosthetic device, implant and graft, initial encounter: Secondary | ICD-10-CM | POA: Diagnosis not present

## 2019-11-22 DIAGNOSIS — M810 Age-related osteoporosis without current pathological fracture: Secondary | ICD-10-CM | POA: Diagnosis not present

## 2019-11-22 DIAGNOSIS — T84117A Breakdown (mechanical) of internal fixation device of bone of left lower leg, initial encounter: Secondary | ICD-10-CM | POA: Diagnosis not present

## 2019-11-22 DIAGNOSIS — Z472 Encounter for removal of internal fixation device: Secondary | ICD-10-CM | POA: Diagnosis not present

## 2019-11-22 DIAGNOSIS — Z87891 Personal history of nicotine dependence: Secondary | ICD-10-CM | POA: Diagnosis not present

## 2019-11-22 DIAGNOSIS — I10 Essential (primary) hypertension: Secondary | ICD-10-CM | POA: Diagnosis not present

## 2019-11-22 DIAGNOSIS — Z7982 Long term (current) use of aspirin: Secondary | ICD-10-CM | POA: Diagnosis not present

## 2019-12-07 DIAGNOSIS — G43019 Migraine without aura, intractable, without status migrainosus: Secondary | ICD-10-CM | POA: Diagnosis not present

## 2019-12-07 DIAGNOSIS — G43719 Chronic migraine without aura, intractable, without status migrainosus: Secondary | ICD-10-CM | POA: Diagnosis not present

## 2020-02-28 DIAGNOSIS — Z23 Encounter for immunization: Secondary | ICD-10-CM | POA: Diagnosis not present

## 2020-05-28 DIAGNOSIS — G43019 Migraine without aura, intractable, without status migrainosus: Secondary | ICD-10-CM | POA: Diagnosis not present

## 2020-05-28 DIAGNOSIS — G43719 Chronic migraine without aura, intractable, without status migrainosus: Secondary | ICD-10-CM | POA: Diagnosis not present

## 2020-07-12 DIAGNOSIS — H04123 Dry eye syndrome of bilateral lacrimal glands: Secondary | ICD-10-CM | POA: Diagnosis not present

## 2020-07-12 DIAGNOSIS — H0102B Squamous blepharitis left eye, upper and lower eyelids: Secondary | ICD-10-CM | POA: Diagnosis not present

## 2020-07-12 DIAGNOSIS — H16223 Keratoconjunctivitis sicca, not specified as Sjogren's, bilateral: Secondary | ICD-10-CM | POA: Diagnosis not present

## 2020-07-12 DIAGNOSIS — H0102A Squamous blepharitis right eye, upper and lower eyelids: Secondary | ICD-10-CM | POA: Diagnosis not present

## 2020-07-23 DIAGNOSIS — Z6824 Body mass index (BMI) 24.0-24.9, adult: Secondary | ICD-10-CM | POA: Diagnosis not present

## 2020-07-23 DIAGNOSIS — L259 Unspecified contact dermatitis, unspecified cause: Secondary | ICD-10-CM | POA: Diagnosis not present

## 2020-08-23 DIAGNOSIS — H0102A Squamous blepharitis right eye, upper and lower eyelids: Secondary | ICD-10-CM | POA: Diagnosis not present

## 2020-08-23 DIAGNOSIS — H16223 Keratoconjunctivitis sicca, not specified as Sjogren's, bilateral: Secondary | ICD-10-CM | POA: Diagnosis not present

## 2020-08-23 DIAGNOSIS — H04123 Dry eye syndrome of bilateral lacrimal glands: Secondary | ICD-10-CM | POA: Diagnosis not present

## 2020-11-27 DIAGNOSIS — Z0000 Encounter for general adult medical examination without abnormal findings: Secondary | ICD-10-CM | POA: Diagnosis not present

## 2020-11-27 DIAGNOSIS — Z79899 Other long term (current) drug therapy: Secondary | ICD-10-CM | POA: Diagnosis not present

## 2020-11-27 DIAGNOSIS — I10 Essential (primary) hypertension: Secondary | ICD-10-CM | POA: Diagnosis not present

## 2020-11-27 DIAGNOSIS — M810 Age-related osteoporosis without current pathological fracture: Secondary | ICD-10-CM | POA: Diagnosis not present

## 2020-11-27 DIAGNOSIS — Z6824 Body mass index (BMI) 24.0-24.9, adult: Secondary | ICD-10-CM | POA: Diagnosis not present

## 2020-11-27 DIAGNOSIS — E785 Hyperlipidemia, unspecified: Secondary | ICD-10-CM | POA: Diagnosis not present

## 2020-11-27 DIAGNOSIS — E039 Hypothyroidism, unspecified: Secondary | ICD-10-CM | POA: Diagnosis not present

## 2020-11-29 DIAGNOSIS — G43719 Chronic migraine without aura, intractable, without status migrainosus: Secondary | ICD-10-CM | POA: Diagnosis not present

## 2020-11-29 DIAGNOSIS — G43019 Migraine without aura, intractable, without status migrainosus: Secondary | ICD-10-CM | POA: Diagnosis not present

## 2021-01-08 DIAGNOSIS — Z1231 Encounter for screening mammogram for malignant neoplasm of breast: Secondary | ICD-10-CM | POA: Diagnosis not present

## 2021-01-08 DIAGNOSIS — Z01419 Encounter for gynecological examination (general) (routine) without abnormal findings: Secondary | ICD-10-CM | POA: Diagnosis not present

## 2021-01-09 DIAGNOSIS — H04123 Dry eye syndrome of bilateral lacrimal glands: Secondary | ICD-10-CM | POA: Diagnosis not present

## 2021-01-09 DIAGNOSIS — H2513 Age-related nuclear cataract, bilateral: Secondary | ICD-10-CM | POA: Diagnosis not present

## 2021-01-09 DIAGNOSIS — H25013 Cortical age-related cataract, bilateral: Secondary | ICD-10-CM | POA: Diagnosis not present

## 2021-01-09 DIAGNOSIS — H16223 Keratoconjunctivitis sicca, not specified as Sjogren's, bilateral: Secondary | ICD-10-CM | POA: Diagnosis not present

## 2021-02-21 DIAGNOSIS — R82998 Other abnormal findings in urine: Secondary | ICD-10-CM | POA: Diagnosis not present

## 2021-02-21 DIAGNOSIS — N3946 Mixed incontinence: Secondary | ICD-10-CM | POA: Diagnosis not present

## 2021-02-21 DIAGNOSIS — R351 Nocturia: Secondary | ICD-10-CM | POA: Diagnosis not present

## 2021-02-24 DIAGNOSIS — H16223 Keratoconjunctivitis sicca, not specified as Sjogren's, bilateral: Secondary | ICD-10-CM | POA: Diagnosis not present

## 2021-02-24 DIAGNOSIS — H04123 Dry eye syndrome of bilateral lacrimal glands: Secondary | ICD-10-CM | POA: Diagnosis not present

## 2021-03-04 DIAGNOSIS — Z23 Encounter for immunization: Secondary | ICD-10-CM | POA: Diagnosis not present

## 2021-05-27 DIAGNOSIS — G43719 Chronic migraine without aura, intractable, without status migrainosus: Secondary | ICD-10-CM | POA: Diagnosis not present

## 2021-05-27 DIAGNOSIS — G43019 Migraine without aura, intractable, without status migrainosus: Secondary | ICD-10-CM | POA: Diagnosis not present

## 2021-08-28 DIAGNOSIS — H0102A Squamous blepharitis right eye, upper and lower eyelids: Secondary | ICD-10-CM | POA: Diagnosis not present

## 2021-08-28 DIAGNOSIS — H524 Presbyopia: Secondary | ICD-10-CM | POA: Diagnosis not present

## 2021-08-28 DIAGNOSIS — H25813 Combined forms of age-related cataract, bilateral: Secondary | ICD-10-CM | POA: Diagnosis not present

## 2021-08-28 DIAGNOSIS — H04123 Dry eye syndrome of bilateral lacrimal glands: Secondary | ICD-10-CM | POA: Diagnosis not present

## 2021-08-28 DIAGNOSIS — H11152 Pinguecula, left eye: Secondary | ICD-10-CM | POA: Diagnosis not present

## 2021-10-31 DIAGNOSIS — H6121 Impacted cerumen, right ear: Secondary | ICD-10-CM | POA: Diagnosis not present

## 2021-10-31 DIAGNOSIS — Z6825 Body mass index (BMI) 25.0-25.9, adult: Secondary | ICD-10-CM | POA: Diagnosis not present

## 2021-10-31 DIAGNOSIS — H6123 Impacted cerumen, bilateral: Secondary | ICD-10-CM | POA: Diagnosis not present

## 2021-10-31 DIAGNOSIS — H60501 Unspecified acute noninfective otitis externa, right ear: Secondary | ICD-10-CM | POA: Diagnosis not present

## 2021-11-19 DIAGNOSIS — G43719 Chronic migraine without aura, intractable, without status migrainosus: Secondary | ICD-10-CM | POA: Diagnosis not present

## 2021-11-19 DIAGNOSIS — G43019 Migraine without aura, intractable, without status migrainosus: Secondary | ICD-10-CM | POA: Diagnosis not present

## 2021-12-15 DIAGNOSIS — K219 Gastro-esophageal reflux disease without esophagitis: Secondary | ICD-10-CM | POA: Diagnosis not present

## 2021-12-15 DIAGNOSIS — E785 Hyperlipidemia, unspecified: Secondary | ICD-10-CM | POA: Diagnosis not present

## 2021-12-15 DIAGNOSIS — E039 Hypothyroidism, unspecified: Secondary | ICD-10-CM | POA: Diagnosis not present

## 2021-12-15 DIAGNOSIS — Z0000 Encounter for general adult medical examination without abnormal findings: Secondary | ICD-10-CM | POA: Diagnosis not present

## 2021-12-15 DIAGNOSIS — Z6825 Body mass index (BMI) 25.0-25.9, adult: Secondary | ICD-10-CM | POA: Diagnosis not present

## 2021-12-15 DIAGNOSIS — Z1331 Encounter for screening for depression: Secondary | ICD-10-CM | POA: Diagnosis not present

## 2021-12-15 DIAGNOSIS — M810 Age-related osteoporosis without current pathological fracture: Secondary | ICD-10-CM | POA: Diagnosis not present

## 2021-12-15 DIAGNOSIS — Z79899 Other long term (current) drug therapy: Secondary | ICD-10-CM | POA: Diagnosis not present

## 2021-12-15 DIAGNOSIS — I10 Essential (primary) hypertension: Secondary | ICD-10-CM | POA: Diagnosis not present

## 2022-01-12 DIAGNOSIS — Z1231 Encounter for screening mammogram for malignant neoplasm of breast: Secondary | ICD-10-CM | POA: Diagnosis not present

## 2022-03-02 DIAGNOSIS — N390 Urinary tract infection, site not specified: Secondary | ICD-10-CM | POA: Diagnosis not present

## 2022-03-13 DIAGNOSIS — Z23 Encounter for immunization: Secondary | ICD-10-CM | POA: Diagnosis not present

## 2022-03-23 DIAGNOSIS — Z6824 Body mass index (BMI) 24.0-24.9, adult: Secondary | ICD-10-CM | POA: Diagnosis not present

## 2022-03-23 DIAGNOSIS — R197 Diarrhea, unspecified: Secondary | ICD-10-CM | POA: Diagnosis not present

## 2022-03-23 DIAGNOSIS — R1013 Epigastric pain: Secondary | ICD-10-CM | POA: Diagnosis not present

## 2022-04-24 DIAGNOSIS — H04123 Dry eye syndrome of bilateral lacrimal glands: Secondary | ICD-10-CM | POA: Diagnosis not present

## 2022-05-05 DIAGNOSIS — Z800 Family history of malignant neoplasm of digestive organs: Secondary | ICD-10-CM | POA: Diagnosis not present

## 2022-05-05 DIAGNOSIS — Z1211 Encounter for screening for malignant neoplasm of colon: Secondary | ICD-10-CM | POA: Diagnosis not present

## 2022-05-21 DIAGNOSIS — G43019 Migraine without aura, intractable, without status migrainosus: Secondary | ICD-10-CM | POA: Diagnosis not present

## 2022-06-04 DIAGNOSIS — Z800 Family history of malignant neoplasm of digestive organs: Secondary | ICD-10-CM | POA: Diagnosis not present

## 2022-06-04 DIAGNOSIS — Z1211 Encounter for screening for malignant neoplasm of colon: Secondary | ICD-10-CM | POA: Diagnosis not present

## 2022-08-24 DIAGNOSIS — Z6824 Body mass index (BMI) 24.0-24.9, adult: Secondary | ICD-10-CM | POA: Diagnosis not present

## 2022-08-24 DIAGNOSIS — R350 Frequency of micturition: Secondary | ICD-10-CM | POA: Diagnosis not present

## 2022-08-25 DIAGNOSIS — R55 Syncope and collapse: Secondary | ICD-10-CM | POA: Diagnosis not present

## 2022-08-25 DIAGNOSIS — I10 Essential (primary) hypertension: Secondary | ICD-10-CM | POA: Diagnosis not present

## 2022-08-25 DIAGNOSIS — R0683 Snoring: Secondary | ICD-10-CM | POA: Diagnosis not present

## 2022-08-25 DIAGNOSIS — Z6824 Body mass index (BMI) 24.0-24.9, adult: Secondary | ICD-10-CM | POA: Diagnosis not present

## 2022-08-27 DIAGNOSIS — R0602 Shortness of breath: Secondary | ICD-10-CM | POA: Diagnosis not present

## 2022-08-27 DIAGNOSIS — G4733 Obstructive sleep apnea (adult) (pediatric): Secondary | ICD-10-CM | POA: Diagnosis not present

## 2022-08-30 DIAGNOSIS — G4733 Obstructive sleep apnea (adult) (pediatric): Secondary | ICD-10-CM | POA: Diagnosis not present

## 2022-08-30 DIAGNOSIS — R0602 Shortness of breath: Secondary | ICD-10-CM | POA: Diagnosis not present

## 2022-09-07 DIAGNOSIS — H25813 Combined forms of age-related cataract, bilateral: Secondary | ICD-10-CM | POA: Diagnosis not present

## 2022-09-07 DIAGNOSIS — H11152 Pinguecula, left eye: Secondary | ICD-10-CM | POA: Diagnosis not present

## 2022-09-07 DIAGNOSIS — H04123 Dry eye syndrome of bilateral lacrimal glands: Secondary | ICD-10-CM | POA: Diagnosis not present

## 2022-09-07 DIAGNOSIS — H524 Presbyopia: Secondary | ICD-10-CM | POA: Diagnosis not present

## 2022-09-07 DIAGNOSIS — H35362 Drusen (degenerative) of macula, left eye: Secondary | ICD-10-CM | POA: Diagnosis not present

## 2022-10-16 DIAGNOSIS — M810 Age-related osteoporosis without current pathological fracture: Secondary | ICD-10-CM | POA: Diagnosis not present

## 2022-10-16 DIAGNOSIS — I10 Essential (primary) hypertension: Secondary | ICD-10-CM | POA: Diagnosis not present

## 2022-10-22 ENCOUNTER — Ambulatory Visit: Payer: Self-pay | Admitting: Allergy and Immunology

## 2022-11-30 DIAGNOSIS — G43019 Migraine without aura, intractable, without status migrainosus: Secondary | ICD-10-CM | POA: Diagnosis not present

## 2022-12-10 DIAGNOSIS — Z79899 Other long term (current) drug therapy: Secondary | ICD-10-CM | POA: Diagnosis not present

## 2022-12-10 DIAGNOSIS — M810 Age-related osteoporosis without current pathological fracture: Secondary | ICD-10-CM | POA: Diagnosis not present

## 2022-12-10 DIAGNOSIS — Z131 Encounter for screening for diabetes mellitus: Secondary | ICD-10-CM | POA: Diagnosis not present

## 2022-12-10 DIAGNOSIS — K219 Gastro-esophageal reflux disease without esophagitis: Secondary | ICD-10-CM | POA: Diagnosis not present

## 2022-12-10 DIAGNOSIS — I10 Essential (primary) hypertension: Secondary | ICD-10-CM | POA: Diagnosis not present

## 2022-12-10 DIAGNOSIS — E039 Hypothyroidism, unspecified: Secondary | ICD-10-CM | POA: Diagnosis not present

## 2022-12-10 DIAGNOSIS — E785 Hyperlipidemia, unspecified: Secondary | ICD-10-CM | POA: Diagnosis not present

## 2022-12-15 ENCOUNTER — Ambulatory Visit: Payer: PPO | Admitting: Allergy

## 2022-12-15 ENCOUNTER — Encounter: Payer: Self-pay | Admitting: Allergy

## 2022-12-15 VITALS — BP 122/76 | HR 67 | Temp 97.9°F | Resp 16 | Ht 59.5 in | Wt 129.0 lb

## 2022-12-15 DIAGNOSIS — H1013 Acute atopic conjunctivitis, bilateral: Secondary | ICD-10-CM

## 2022-12-15 NOTE — Patient Instructions (Addendum)
-   Testing today showed: weeds, trees, indoor molds, outdoor molds, dust mites, and dog - Copy of test results provided.  - Avoidance measures provided. - Continue with: Pataday (olopatadine) one drop per eye twice daily as needed for watery, itchy or red eyes - Start taking: Allegra (fexofenadine) 180mg  tablet OR Xyzal (levocetirizine) 5mg  tablet once daily as needed.   These are OTC antihistamines that are usually more effective than Claritin.  - You can use an extra dose of the antihistamine, if needed, for breakthrough symptoms.  - Consider allergy shots as a means of long-term control. - Allergy shots "re-train" and "reset" the immune system to ignore environmental allergens and decrease the resulting immune response to those allergens (sneezing, itchy watery eyes, runny nose, nasal congestion, etc).    - Allergy shots improve symptoms in 75-85% of patients.  - We can discuss more at future appointment if the medications are not working for you.  - Patch testing is the test of choice to evaluate for contact dermatitis that can be in fabrics.  Recommend performing patch testing with the TRUE test patch panels.  Patches are best placed on a Monday with return to office on Wednesday and Friday of same week for readings.  Once patches are in place to do not get them wet.  You can take antihistamines while patches are in place.   True Test looks for the following sensitivities:      Follow-up for patch testing

## 2022-12-15 NOTE — Progress Notes (Addendum)
New Patient Note  RE: Casey Heath MRN: [id number] DOB: 1950/09/04 Date of Office Visit: 12/15/2022   Primary care provider: Julianne Handler, NP  Chief Complaint: watery eyes  History of present illness: Casey Heath is a 72 y.o. female presenting today for evaluation of allergies.   She has been having frequent watery eyes and feels eye symptoms are triggered by fabrics.  She states if she goes in a store with certain types of fabrics, dress stores, her church which is very old and has fabric on pews she has watery eyes.  She feels like there could be dyes in the fabric that sets it off.  She states even at home some days are just not good days with watering eyes.  She states her vision can be impaired from all the watering.  She states warm compress can be helpful and closing her eyes for a while helps.  She states she had an outfit made from materials from [place] and this outfit caused eyes to water but other factors also trigger it.  She uses pataday daily and does help.  She states she did use claritin for about 2 weeks at a time but did not see a benefit.  She sees Dr Zenaida Niece at [facility] in [place] for dry eye that was diagnosed several years ago.  She is on Tyrvaya for dry eye and this works really well for her.   Denies itching, red eyes.  Denies history of allergic rhinitis and no history of asthma or eczema.   Review of systems: 10pt ROS negative unless noted above in HPI  Past medical history: Past Medical History:  Diagnosis Date   Dry eye    Hiatal hernia    Hypercholesterolemia    Hypertension    Osteoporosis    Thyroid disease     Past surgical history: History reviewed. No pertinent surgical history.  Family history:  History reviewed. No pertinent family history.  Social history: Lives in a home without carpeting with electric heating and heat pump cooling.  No pets in the home.  No concern for water damage, mildew or roaches in the home.   Denies smoking history.    Medication List: Current Outpatient Medications  Medication Sig Dispense Refill   Cholecalciferol (VITAMIN D-3 PO) Take by mouth.     Ergocalciferol 10 MCG (400 UNIT) TABS Take by mouth.     esomeprazole (NEXIUM) 20 MG capsule Take by mouth.     imipramine (TOFRANIL) 50 MG tablet      levothyroxine (SYNTHROID) 75 MCG tablet Take 75 mcg by mouth daily.     losartan (COZAAR) 50 MG tablet Take 50 mg by mouth daily.     Olopatadine HCl (PATADAY) 0.2 % SOLN Apply to eye.     pravastatin (PRAVACHOL) 40 MG tablet      TYRVAYA 0.03 MG/ACT SOLN Place 1 spray into both nostrils 2 (two) times daily.     No current facility-administered medications for this visit.    Known medication allergies: Allergies  Allergen Reactions   Codeine     nausea   Penicillins     Nausea   Sulfa Antibiotics     rash     Physical examination: Blood pressure 122/76, pulse 67, temperature 97.9 F (36.6 C), temperature source Temporal, resp. rate 16, height 4' 11.5" (1.511 m), weight 129 lb (58.5 kg), SpO2 97%.  General: Alert, interactive, in no acute distress. HEENT: PERRLA, TMs pearly gray, turbinates non-edematous  without discharge, post-pharynx non erythematous. Neck: Supple without lymphadenopathy. Lungs: Clear to auscultation without wheezing, rhonchi or rales. {no increased work of breathing. CV: Normal S1, S2 without murmurs. Abdomen: Nondistended, nontender. Skin: Warm and dry, without lesions or rashes. Extremities:  No clubbing, cyanosis or edema. Neuro:   Grossly intact.  Diagnositics/Labs:  Allergy testing:   Airborne Adult Perc - 12/15/22 1037     Time Antigen Placed 1037    Allergen Manufacturer Waynette Buttery    Location Back    Number of Test 55    1. Control-Buffer 50% Glycerol Negative    2. Control-Histamine 2+    3. Bahia Negative    4. French Southern Territ[place] Negative    5. Johnson Negative    6. Kentucky Blue Negative    7. Meadow Fescue Negative    8. Perennial  Rye Negative    9. Timothy Negative    10. Ragweed Mix Negative    11. Cocklebur Negative    12. Plantain,  English Negative    13. Baccharis Negative    14. Dog Fennel Negative    15. Russian Thistle Negative    16. Lamb's Quarters Negative    17. Sheep Sorrell 2+    18. Rough Pigweed 2+    19. Marsh Elder, Rough 2+    20. Mugwort, Common Negative    21. Box, Elder 2+    22. Cedar, red Negative    23. Sweet Gum Negative    24. Pecan Pollen 2+    25. Pine Mix 2+    26. Walnut, Black Pollen Negative    27. Red Mulberry Negative    28. Ash Mix Negative    29. Birch Mix Negative    30. Beech American Negative    31. Cottonwood, [place] Negative    32. Hickory, White Negative    33. Maple Mix Negative    34. Oak, [place] Mix Negative    35. Sycamore Eastern Negative    36. Alternaria Alternata Negative    37. Cladosporium Herbarum 2+    38. Aspergillus Mix Negative    39. Penicillium Mix 2+    40. Bipolaris Sorokiniana (Helminthosporium) 2+    41. Drechslera Spicifera (Curvularia) Negative    42. Mucor Plumbeus Negative    43. Fusarium Moniliforme 2+    44. Aureobasidium Pullulans (pullulara) 2+    45. Rhizopus Oryzae 2+    46. Botrytis Cinera Negative    47. Epicoccum Nigrum Negative    48. Phoma Betae Negative    49. Dust Mite Mix 2+    50. Cat Hair 10,000 BAU/ml Negative    51.  Dog Epithelia 2+    52. Mixed Feathers Negative    53. Horse Epithelia Negative    54. Cockroach, German Negative    55. Tobacco Leaf Negative             Allergy testing results were read and interpreted by provider, documented by clinical staff.   Assessment and plan: Allergic conjunctivitis  - Testing today showed: weeds, trees, indoor molds, outdoor molds, dust mites, and dog - Copy of test results provided.  - Avoidance measures provided. - Continue with: Pataday (olopatadine) one drop per eye twice daily as needed for watery, itchy or red eyes - Start taking: Allegra  (fexofenadine) 180mg  tablet OR Xyzal (levocetirizine) 5mg  tablet once daily as needed.   These are OTC antihistamines that are usually more effective than Claritin.  - You can use an extra dose of the antihistamine,  if needed, for breakthrough symptoms.  - Consider allergy shots as a means of long-term control. - Allergy shots "re-train" and "reset" the immune system to ignore environmental allergens and decrease the resulting immune response to those allergens (sneezing, itchy watery eyes, runny nose, nasal congestion, etc).    - Allergy shots improve symptoms in 75-85% of patients.  - We can discuss more at future appointment if the medications are not working for you.  - Patch testing is the test of choice to evaluate for contact dermatitis that can be in fabrics.  Recommend performing patch testing with the TRUE test patch panels.  Patches are best placed on a Monday with return to office on Wednesday and Friday of same week for readings.  Once patches are in place to do not get them wet.  You can take antihistamines while patches are in place.    Follow-up for patch testing   I appreciate the opportunity to take part in [place] care. Please do not hesitate to contact me with questions.  Sincerely,   Margo Aye, MD Allergy/Immunology [facility]

## 2023-02-01 DIAGNOSIS — Z01419 Encounter for gynecological examination (general) (routine) without abnormal findings: Secondary | ICD-10-CM | POA: Diagnosis not present

## 2023-02-01 DIAGNOSIS — Z1231 Encounter for screening mammogram for malignant neoplasm of breast: Secondary | ICD-10-CM | POA: Diagnosis not present

## 2023-02-23 DIAGNOSIS — L570 Actinic keratosis: Secondary | ICD-10-CM | POA: Diagnosis not present

## 2023-02-23 DIAGNOSIS — L821 Other seborrheic keratosis: Secondary | ICD-10-CM | POA: Diagnosis not present

## 2023-02-23 DIAGNOSIS — L111 Transient acantholytic dermatosis [Grover]: Secondary | ICD-10-CM | POA: Diagnosis not present

## 2023-02-23 DIAGNOSIS — D225 Melanocytic nevi of trunk: Secondary | ICD-10-CM | POA: Diagnosis not present

## 2023-02-23 DIAGNOSIS — C44519 Basal cell carcinoma of skin of other part of trunk: Secondary | ICD-10-CM | POA: Diagnosis not present

## 2023-02-23 DIAGNOSIS — D492 Neoplasm of unspecified behavior of bone, soft tissue, and skin: Secondary | ICD-10-CM | POA: Diagnosis not present

## 2023-02-23 DIAGNOSIS — L814 Other melanin hyperpigmentation: Secondary | ICD-10-CM | POA: Diagnosis not present

## 2023-03-15 DIAGNOSIS — Z23 Encounter for immunization: Secondary | ICD-10-CM | POA: Diagnosis not present

## 2023-04-02 DIAGNOSIS — C44519 Basal cell carcinoma of skin of other part of trunk: Secondary | ICD-10-CM | POA: Diagnosis not present

## 2023-05-26 DIAGNOSIS — G43019 Migraine without aura, intractable, without status migrainosus: Secondary | ICD-10-CM | POA: Diagnosis not present

## 2023-06-05 DIAGNOSIS — R300 Dysuria: Secondary | ICD-10-CM | POA: Diagnosis not present

## 2023-06-05 DIAGNOSIS — R350 Frequency of micturition: Secondary | ICD-10-CM | POA: Diagnosis not present

## 2023-06-05 DIAGNOSIS — N3000 Acute cystitis without hematuria: Secondary | ICD-10-CM | POA: Diagnosis not present

## 2023-06-05 DIAGNOSIS — R3915 Urgency of urination: Secondary | ICD-10-CM | POA: Diagnosis not present

## 2023-06-10 DIAGNOSIS — E785 Hyperlipidemia, unspecified: Secondary | ICD-10-CM | POA: Diagnosis not present

## 2023-06-10 DIAGNOSIS — C4491 Basal cell carcinoma of skin, unspecified: Secondary | ICD-10-CM | POA: Diagnosis not present

## 2023-06-10 DIAGNOSIS — Z131 Encounter for screening for diabetes mellitus: Secondary | ICD-10-CM | POA: Diagnosis not present

## 2023-06-10 DIAGNOSIS — Z1339 Encounter for screening examination for other mental health and behavioral disorders: Secondary | ICD-10-CM | POA: Diagnosis not present

## 2023-06-10 DIAGNOSIS — I10 Essential (primary) hypertension: Secondary | ICD-10-CM | POA: Diagnosis not present

## 2023-06-10 DIAGNOSIS — E039 Hypothyroidism, unspecified: Secondary | ICD-10-CM | POA: Diagnosis not present

## 2023-06-10 DIAGNOSIS — Z9181 History of falling: Secondary | ICD-10-CM | POA: Diagnosis not present

## 2023-06-10 DIAGNOSIS — M810 Age-related osteoporosis without current pathological fracture: Secondary | ICD-10-CM | POA: Diagnosis not present

## 2023-06-10 DIAGNOSIS — G4733 Obstructive sleep apnea (adult) (pediatric): Secondary | ICD-10-CM | POA: Diagnosis not present

## 2023-06-10 DIAGNOSIS — K219 Gastro-esophageal reflux disease without esophagitis: Secondary | ICD-10-CM | POA: Diagnosis not present

## 2023-06-10 DIAGNOSIS — Z79899 Other long term (current) drug therapy: Secondary | ICD-10-CM | POA: Diagnosis not present

## 2023-06-10 DIAGNOSIS — Z0000 Encounter for general adult medical examination without abnormal findings: Secondary | ICD-10-CM | POA: Diagnosis not present

## 2023-06-22 DIAGNOSIS — H16223 Keratoconjunctivitis sicca, not specified as Sjogren's, bilateral: Secondary | ICD-10-CM | POA: Diagnosis not present

## 2023-08-23 DIAGNOSIS — N3000 Acute cystitis without hematuria: Secondary | ICD-10-CM | POA: Diagnosis not present

## 2023-09-16 DIAGNOSIS — H16223 Keratoconjunctivitis sicca, not specified as Sjogren's, bilateral: Secondary | ICD-10-CM | POA: Diagnosis not present

## 2023-12-20 DIAGNOSIS — G43719 Chronic migraine without aura, intractable, without status migrainosus: Secondary | ICD-10-CM | POA: Diagnosis not present

## 2024-02-03 DIAGNOSIS — Z1231 Encounter for screening mammogram for malignant neoplasm of breast: Secondary | ICD-10-CM | POA: Diagnosis not present

## 2024-02-23 DIAGNOSIS — L821 Other seborrheic keratosis: Secondary | ICD-10-CM | POA: Diagnosis not present

## 2024-02-23 DIAGNOSIS — L814 Other melanin hyperpigmentation: Secondary | ICD-10-CM | POA: Diagnosis not present

## 2024-02-23 DIAGNOSIS — Z23 Encounter for immunization: Secondary | ICD-10-CM | POA: Diagnosis not present

## 2024-02-23 DIAGNOSIS — D225 Melanocytic nevi of trunk: Secondary | ICD-10-CM | POA: Diagnosis not present

## 2024-03-10 DIAGNOSIS — Z6826 Body mass index (BMI) 26.0-26.9, adult: Secondary | ICD-10-CM | POA: Diagnosis not present

## 2024-03-10 DIAGNOSIS — R300 Dysuria: Secondary | ICD-10-CM | POA: Diagnosis not present

## 2024-04-18 DIAGNOSIS — M810 Age-related osteoporosis without current pathological fracture: Secondary | ICD-10-CM | POA: Diagnosis not present

## 2024-04-18 DIAGNOSIS — Z6826 Body mass index (BMI) 26.0-26.9, adult: Secondary | ICD-10-CM | POA: Diagnosis not present

## 2024-04-18 DIAGNOSIS — K219 Gastro-esophageal reflux disease without esophagitis: Secondary | ICD-10-CM | POA: Diagnosis not present
